# Patient Record
Sex: Male | Born: 1980 | Race: White | Hispanic: Yes | Marital: Single | Smoking: Never smoker
Health system: Southern US, Community
[De-identification: ages and names within clinical notes are randomized; demographics above are authoritative.]

---

## 2016-05-07 ENCOUNTER — Emergency Department (HOSPITAL_COMMUNITY): Payer: Self-pay

## 2016-05-07 ENCOUNTER — Encounter (HOSPITAL_COMMUNITY): Payer: Self-pay | Admitting: Emergency Medicine

## 2016-05-07 ENCOUNTER — Emergency Department (HOSPITAL_COMMUNITY)
Admission: EM | Admit: 2016-05-07 | Discharge: 2016-05-08 | Disposition: A | Discharge status: Home / Self Care | Payer: Self-pay | Attending: Emergency Medicine | Admitting: Emergency Medicine

## 2016-05-07 DIAGNOSIS — Z5181 Encounter for therapeutic drug level monitoring: Secondary | ICD-10-CM | POA: Insufficient documentation

## 2016-05-07 DIAGNOSIS — R931 Abnormal findings on diagnostic imaging of heart and coronary circulation: Secondary | ICD-10-CM | POA: Insufficient documentation

## 2016-05-07 DIAGNOSIS — R4182 Altered mental status, unspecified: Secondary | ICD-10-CM | POA: Insufficient documentation

## 2016-05-07 DIAGNOSIS — F10120 Alcohol abuse with intoxication, uncomplicated: Secondary | ICD-10-CM | POA: Insufficient documentation

## 2016-05-07 DIAGNOSIS — Y9389 Activity, other specified: Secondary | ICD-10-CM | POA: Insufficient documentation

## 2016-05-07 DIAGNOSIS — S0081XA Abrasion of other part of head, initial encounter: Secondary | ICD-10-CM | POA: Insufficient documentation

## 2016-05-07 DIAGNOSIS — F10920 Alcohol use, unspecified with intoxication, uncomplicated: Secondary | ICD-10-CM

## 2016-05-07 DIAGNOSIS — T07XXXA Unspecified multiple injuries, initial encounter: Secondary | ICD-10-CM

## 2016-05-07 DIAGNOSIS — R935 Abnormal findings on diagnostic imaging of other abdominal regions, including retroperitoneum: Secondary | ICD-10-CM | POA: Insufficient documentation

## 2016-05-07 DIAGNOSIS — Y999 Unspecified external cause status: Secondary | ICD-10-CM | POA: Insufficient documentation

## 2016-05-07 DIAGNOSIS — Y9289 Other specified places as the place of occurrence of the external cause: Secondary | ICD-10-CM | POA: Insufficient documentation

## 2016-05-07 LAB — I-STAT CG4 LACTIC ACID, ED: Lactic Acid, Venous: 1.76 mmol/L (ref 0.5–1.9)

## 2016-05-07 LAB — COMPREHENSIVE METABOLIC PANEL
ALK PHOS: 89 U/L (ref 38–126)
ALT: 23 U/L (ref 17–63)
AST: 31 U/L (ref 15–41)
Albumin: 4.3 g/dL (ref 3.5–5.0)
Anion gap: 11 (ref 5–15)
BILIRUBIN TOTAL: 0.4 mg/dL (ref 0.3–1.2)
BUN: 8 mg/dL (ref 6–20)
CALCIUM: 8.7 mg/dL — AB (ref 8.9–10.3)
CO2: 21 mmol/L — ABNORMAL LOW (ref 22–32)
CREATININE: 1.1 mg/dL (ref 0.61–1.24)
Chloride: 104 mmol/L (ref 101–111)
Glucose, Bld: 110 mg/dL — ABNORMAL HIGH (ref 65–99)
Potassium: 3.3 mmol/L — ABNORMAL LOW (ref 3.5–5.1)
Sodium: 136 mmol/L (ref 135–145)
Total Protein: 7.4 g/dL (ref 6.5–8.1)

## 2016-05-07 LAB — URINALYSIS, ROUTINE W REFLEX MICROSCOPIC
BILIRUBIN URINE: NEGATIVE
Glucose, UA: NEGATIVE mg/dL
Hgb urine dipstick: NEGATIVE
KETONES UR: NEGATIVE mg/dL
LEUKOCYTES UA: NEGATIVE
NITRITE: NEGATIVE
PROTEIN: NEGATIVE mg/dL
Specific Gravity, Urine: 1.002 — ABNORMAL LOW (ref 1.005–1.030)
pH: 5 (ref 5.0–8.0)

## 2016-05-07 LAB — PROTIME-INR
INR: 1.06
PROTHROMBIN TIME: 13.8 s (ref 11.4–15.2)

## 2016-05-07 LAB — CBC
HCT: 46 % (ref 39.0–52.0)
Hemoglobin: 16.3 g/dL (ref 13.0–17.0)
MCH: 30.6 pg (ref 26.0–34.0)
MCHC: 35.4 g/dL (ref 30.0–36.0)
MCV: 86.5 fL (ref 78.0–100.0)
PLATELETS: 220 10*3/uL (ref 150–400)
RBC: 5.32 MIL/uL (ref 4.22–5.81)
RDW: 12.5 % (ref 11.5–15.5)
WBC: 6.2 10*3/uL (ref 4.0–10.5)

## 2016-05-07 LAB — I-STAT CHEM 8, ED
BUN: 9 mg/dL (ref 6–20)
CHLORIDE: 103 mmol/L (ref 101–111)
CREATININE: 1.5 mg/dL — AB (ref 0.61–1.24)
Calcium, Ion: 1.06 mmol/L — ABNORMAL LOW (ref 1.15–1.40)
GLUCOSE: 112 mg/dL — AB (ref 65–99)
HCT: 49 % (ref 39.0–52.0)
HEMOGLOBIN: 16.7 g/dL (ref 13.0–17.0)
POTASSIUM: 3.3 mmol/L — AB (ref 3.5–5.1)
Sodium: 138 mmol/L (ref 135–145)
TCO2: 23 mmol/L (ref 0–100)

## 2016-05-07 LAB — SAMPLE TO BLOOD BANK

## 2016-05-07 LAB — CDS SEROLOGY

## 2016-05-07 LAB — ETHANOL: ALCOHOL ETHYL (B): 418 mg/dL — AB (ref <5)

## 2016-05-07 MED ORDER — IOPAMIDOL (ISOVUE-300) INJECTION 61%
INTRAVENOUS | Status: AC
  Administered 2016-05-07: 100 mL
  Filled 2016-05-07: qty 100

## 2016-05-07 MED ORDER — SODIUM CHLORIDE 0.9 % IV BOLUS (SEPSIS)
1000.0000 mL | Freq: Once | INTRAVENOUS | Status: AC
  Administered 2016-05-07: 1000 mL via INTRAVENOUS
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
## 2016-05-08 NOTE — ED Notes (Signed)
Family here in the ED will take patient home.
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
